# Patient Record
Sex: Female | Born: 2005 | Race: Black or African American | Hispanic: No | Marital: Single | Smoking: Never smoker
Health system: Southern US, Community
[De-identification: ages and names within clinical notes are randomized; demographics above are authoritative.]

## PROBLEM LIST (undated history)

## (undated) DIAGNOSIS — Z789 Other specified health status: Secondary | ICD-10-CM

## (undated) HISTORY — PX: TONSILLECTOMY AND ADENOIDECTOMY: SUR1326

## (undated) HISTORY — DX: Other specified health status: Z78.9

---
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
## 2023-01-07 ENCOUNTER — Ambulatory Visit (LOCAL_COMMUNITY_HEALTH_CENTER): Payer: Medicaid Other

## 2023-01-07 VITALS — BP 126/81 | Ht 62.0 in | Wt 165.0 lb

## 2023-01-07 DIAGNOSIS — Z3009 Encounter for other general counseling and advice on contraception: Secondary | ICD-10-CM

## 2023-01-07 DIAGNOSIS — Z309 Encounter for contraceptive management, unspecified: Secondary | ICD-10-CM

## 2023-01-07 DIAGNOSIS — Z3042 Encounter for surveillance of injectable contraceptive: Secondary | ICD-10-CM | POA: Diagnosis not present

## 2023-01-07 NOTE — Progress Notes (Signed)
15 weeks 1 day post depo.  Denies any complaints and desires to continue method.  Depo given IM LUOQ as ordered by A. White FNP dated 09/23/22; tolerated well. Recommended daily MVI.  Next depo due 03/25/23 and instructed keep return depo appt around 11 weeks; appt reminder given.  Tonny Branch, RN

## 2023-04-03 ENCOUNTER — Ambulatory Visit (LOCAL_COMMUNITY_HEALTH_CENTER): Payer: No Typology Code available for payment source

## 2023-04-03 VITALS — BP 131/88 | Ht 62.0 in | Wt 164.0 lb

## 2023-04-03 DIAGNOSIS — Z30013 Encounter for initial prescription of injectable contraceptive: Secondary | ICD-10-CM | POA: Diagnosis not present

## 2023-04-03 DIAGNOSIS — Z309 Encounter for contraceptive management, unspecified: Secondary | ICD-10-CM

## 2023-04-03 DIAGNOSIS — Z3009 Encounter for other general counseling and advice on contraception: Secondary | ICD-10-CM

## 2023-04-03 DIAGNOSIS — Z3042 Encounter for surveillance of injectable contraceptive: Secondary | ICD-10-CM

## 2023-04-03 NOTE — Progress Notes (Signed)
12 weeks 2 days post depo.  Denies any problems and desires to continue.  Depo given IM RUOQ per order by A. White FNP dated 09/23/22; tolerated well.  Next depo due 06/19/23 and has appt reminder. Bp 131/88 and suggested check as possible.     [name], RN

## 2023-07-17 ENCOUNTER — Ambulatory Visit (LOCAL_COMMUNITY_HEALTH_CENTER): Payer: No Typology Code available for payment source

## 2023-07-17 VITALS — BP 123/76 | Ht 62.0 in | Wt 162.5 lb

## 2023-07-17 DIAGNOSIS — Z309 Encounter for contraceptive management, unspecified: Secondary | ICD-10-CM

## 2023-07-17 DIAGNOSIS — Z3009 Encounter for other general counseling and advice on contraception: Secondary | ICD-10-CM

## 2023-07-17 DIAGNOSIS — Z3042 Encounter for surveillance of injectable contraceptive: Secondary | ICD-10-CM

## 2023-07-17 DIAGNOSIS — Z30013 Encounter for initial prescription of injectable contraceptive: Secondary | ICD-10-CM

## 2023-07-17 NOTE — Progress Notes (Signed)
15 weeks 0 days post depo. Voices no concerns. Counseled to adhere to 11 to 13 week interval between depo injections for optimal benefit. Voices understanding.   Depo given per order by Onalee Hua, FNP dated 09/23/2022. Tolerated well LUOQ. Plans to have annual PE when next depo is due 10/02/2023. Has reminder. Jerel Shepherd, RN

## 2023-10-14 ENCOUNTER — Ambulatory Visit: Payer: No Typology Code available for payment source | Admitting: Nurse Practitioner

## 2023-10-14 VITALS — BP 123/75 | HR 53 | Ht 62.0 in | Wt 160.2 lb

## 2023-10-14 DIAGNOSIS — Z30013 Encounter for initial prescription of injectable contraceptive: Secondary | ICD-10-CM

## 2023-10-14 DIAGNOSIS — Z3009 Encounter for other general counseling and advice on contraception: Secondary | ICD-10-CM

## 2023-10-14 DIAGNOSIS — Z3042 Encounter for surveillance of injectable contraceptive: Secondary | ICD-10-CM

## 2023-10-14 NOTE — Progress Notes (Signed)
Pt late for physical, depo given by nurse in RUOQ. Tolerated well, reminder card given and encouraged pt to schedule physical when exiting. All questions answered, condoms declined. Gaspar Garbe, RN

## 2023-10-15 ENCOUNTER — Ambulatory Visit: Payer: No Typology Code available for payment source

## 2023-10-28 ENCOUNTER — Ambulatory Visit: Payer: No Typology Code available for payment source

## 2024-05-09 ENCOUNTER — Encounter: Payer: Self-pay | Admitting: Family Medicine

## 2024-05-09 ENCOUNTER — Ambulatory Visit: Admitting: Nurse Practitioner

## 2024-05-09 VITALS — BP 129/84 | HR 69 | Ht 62.0 in | Wt 157.4 lb

## 2024-05-09 DIAGNOSIS — Z30013 Encounter for initial prescription of injectable contraceptive: Secondary | ICD-10-CM

## 2024-05-09 DIAGNOSIS — Z3009 Encounter for other general counseling and advice on contraception: Secondary | ICD-10-CM | POA: Diagnosis not present

## 2024-05-09 DIAGNOSIS — Z3042 Encounter for surveillance of injectable contraceptive: Secondary | ICD-10-CM

## 2024-05-09 MED ORDER — MEDROXYPROGESTERONE ACETATE 150 MG/ML IM SUSP
150.0000 mg | INTRAMUSCULAR | Status: AC
  Administered 2024-05-09 – 2024-08-03 (×2): 150 mg via INTRAMUSCULAR

## 2024-05-09 NOTE — Progress Notes (Signed)
 [facility] Problem Visit  [facility]  Subjective:  Jody Ramsey is a 18 y.o. being seen today to restart hormonal injection for contraception.  Chief Complaint  Patient presents with   Annual Exam    Physical and restart Depo    Patient is a pleasant 18 year-old female who presents to the office today to restart hormonal injection for contraceptive purposes. She reports 1 female partner and last sex 3 days ago with a condom. She reports her most recent hormonal injection was 10/14/23 and she is past due for next injection by 13-18 weeks. She reports no period since starting hormonal injection and has spotting monthly. She was seen by her pediatrician in January (about 4.5 months ago) for an annual physical and they told her they recommended her stopping the injections because she had been on them too long. Patient reports deciding she would not like a pregnancy until she is 18 years old and she personally feels it is safe to be on the hormonal injection for 3-4 more years.  Patient reports she does not take a multivitamin daily.  Patient does not want STI testing today and she is not due for her physical. Patient will begin PAP at age 16.   Health Maintenance Due  Topic Date Due   HPV VACCINES (2 - 2-dose series) 01/28/2017   CHLAMYDIA SCREENING  Never done   HIV Screening  Never done   Meningococcal B Vaccine (1 of 2 - Standard) Never done   Hepatitis C Screening  Never done   COVID-19 Vaccine (1 - 2024-25 season) Never done     The following portions of the patient's history were reviewed and updated as appropriate: allergies, current medications, past family history, past medical history, past social history, past surgical history and problem list. Problem list updated.   See flowsheet for other program required questions.  Objective:   Vitals:   05/09/24 1016  BP: 129/84  Pulse: 69  Weight: 157 lb 6.4 oz (71.4 kg)  Height: 5\' 2"  (1.575  m)   Patient not due for PE. Documentation below is what could be seen during interview only. Physical Exam Nursing note reviewed.  Constitutional:      General: She is not in acute distress.    Appearance: Normal appearance.  HENT:     Mouth/Throat:     Lips: Pink. No lesions.  Eyes:     General: No scleral icterus.       Right eye: No discharge.        Left eye: No discharge.     Conjunctiva/sclera:     Right eye: Right conjunctiva is not injected. No exudate or hemorrhage.    Left eye: Left conjunctiva is not injected. No exudate or hemorrhage. Pulmonary:     Effort: Pulmonary effort is normal.  Genitourinary:    Comments: Declined genital exam.  Skin:    Comments: Skin tone appropriate for ethnicity. Assessed exposed areas only.    Neurological:     Mental Status: She is alert and oriented to person, place, and time.  Psychiatric:        Attention and Perception: Attention and perception normal.        Mood and Affect: Mood and affect normal.        Speech: Speech normal.        Behavior: Behavior normal. Behavior is cooperative.        Thought Content: Thought content normal.   Assessment and  Plan:  Jody Ramsey is a 18 y.o. female presenting to the [facility] Department for a Women's Health problem visit  1. Encounter for management and injection of injectable progestin contraceptive (Primary) Patient safe to restart hormonal injection today. She declined STI testing.  Patient advised to begin a prenatal or multi-vitamin and calcium and vitamin D supplement as hormonal injection has been shown to cause bone loss. Educated patient that bone loss is reversible once injections are stopped. Advised the prenatal or multivitamin may decrease the risk of spina bifida in a baby.  Patient verbalized understanding and agreed with plan.   - medroxyPROGESTERone  (DEPO-PROVERA ) injection 150 mg  Return in about 11 weeks (around 07/25/2024) for Depo.  No future  appointments.  Total time with patient 15 minutes.   Merleen Stare, NP

## 2024-05-09 NOTE — Progress Notes (Signed)
 Pt here for annual physical and to restart Depo Provera .  Declines STI screening.  Depo Provera  150mg  Im given in RUOQ without complications.  Recommended back up method x 7 days post injection.  Condoms given.  Family planning education card given.  PCP list provided.  Reminder card for next injection given.-Zebulen Simonis, RN

## 2024-08-03 ENCOUNTER — Ambulatory Visit

## 2024-08-03 ENCOUNTER — Ambulatory Visit (LOCAL_COMMUNITY_HEALTH_CENTER)

## 2024-08-03 VITALS — BP 133/63 | Ht 62.0 in | Wt 161.5 lb

## 2024-08-03 DIAGNOSIS — Z30013 Encounter for initial prescription of injectable contraceptive: Secondary | ICD-10-CM | POA: Diagnosis not present

## 2024-08-03 DIAGNOSIS — Z3009 Encounter for other general counseling and advice on contraception: Secondary | ICD-10-CM | POA: Diagnosis not present

## 2024-08-03 DIAGNOSIS — Z3042 Encounter for surveillance of injectable contraceptive: Secondary | ICD-10-CM

## 2024-08-03 NOTE — Progress Notes (Signed)
 12 Weeks   2 Days since last Depo Voices no concerns today.  Counseled to adhere to 11 to 13 week intervals between depo injections for optimal benefit.  Depo given today per order by J.Idol, FNP  dated 05/09/2024.  Tolerated well given LUOQ.SABRA  Next depo due 10/19/2024 has reminder card.
